# Patient Record
Sex: Female | Born: 1975 | Race: White | Hispanic: No | Marital: Married | Smoking: Never smoker
Health system: Southern US, Community
[De-identification: ages and names within clinical notes are randomized; demographics above are authoritative.]

## PROBLEM LIST (undated history)

## (undated) DIAGNOSIS — Z8619 Personal history of other infectious and parasitic diseases: Secondary | ICD-10-CM

## (undated) DIAGNOSIS — O09529 Supervision of elderly multigravida, unspecified trimester: Secondary | ICD-10-CM

## (undated) DIAGNOSIS — R569 Unspecified convulsions: Secondary | ICD-10-CM

## (undated) HISTORY — DX: Supervision of elderly multigravida, unspecified trimester: O09.529

## (undated) HISTORY — DX: Personal history of other infectious and parasitic diseases: Z86.19

## (undated) HISTORY — DX: Unspecified convulsions: R56.9

## (undated) HISTORY — PX: NO PAST SURGERIES: SHX2092

---

## 1998-04-01 ENCOUNTER — Other Ambulatory Visit: Admission: RE | Admit: 1998-04-01 | Discharge: 1998-04-01 | Payer: Self-pay | Admitting: *Deleted

## 1999-04-21 ENCOUNTER — Other Ambulatory Visit: Admission: RE | Admit: 1999-04-21 | Discharge: 1999-04-21 | Payer: Self-pay | Admitting: *Deleted

## 2001-09-29 ENCOUNTER — Other Ambulatory Visit: Admission: RE | Admit: 2001-09-29 | Discharge: 2001-09-29 | Payer: Self-pay | Admitting: *Deleted

## 2002-12-29 ENCOUNTER — Other Ambulatory Visit: Admission: RE | Admit: 2002-12-29 | Discharge: 2002-12-29 | Payer: Self-pay | Admitting: *Deleted

## 2003-07-27 ENCOUNTER — Inpatient Hospital Stay (HOSPITAL_COMMUNITY): Admission: AD | Admit: 2003-07-27 | Discharge: 2003-07-29 | Payer: Self-pay | Admitting: *Deleted

## 2003-09-04 ENCOUNTER — Other Ambulatory Visit: Admission: RE | Admit: 2003-09-04 | Discharge: 2003-09-04 | Payer: Self-pay | Admitting: Obstetrics and Gynecology

## 2004-09-22 ENCOUNTER — Other Ambulatory Visit: Admission: RE | Admit: 2004-09-22 | Discharge: 2004-09-22 | Payer: Self-pay | Admitting: Obstetrics and Gynecology

## 2006-05-09 ENCOUNTER — Inpatient Hospital Stay (HOSPITAL_COMMUNITY): Admission: AD | Admit: 2006-05-09 | Discharge: 2006-05-09 | Payer: Self-pay | Admitting: Obstetrics and Gynecology

## 2006-05-12 ENCOUNTER — Inpatient Hospital Stay (HOSPITAL_COMMUNITY): Admission: AD | Admit: 2006-05-12 | Discharge: 2006-05-12 | Payer: Self-pay | Admitting: Obstetrics and Gynecology

## 2006-05-25 ENCOUNTER — Inpatient Hospital Stay (HOSPITAL_COMMUNITY): Admission: AD | Admit: 2006-05-25 | Discharge: 2006-05-27 | Payer: Self-pay | Admitting: Obstetrics and Gynecology

## 2006-05-28 ENCOUNTER — Encounter: Admission: RE | Admit: 2006-05-28 | Discharge: 2006-06-27 | Payer: Self-pay | Admitting: Obstetrics and Gynecology

## 2006-06-28 ENCOUNTER — Encounter: Admission: RE | Admit: 2006-06-28 | Discharge: 2006-07-27 | Payer: Self-pay | Admitting: Obstetrics and Gynecology

## 2008-08-05 ENCOUNTER — Ambulatory Visit: Payer: Self-pay | Admitting: Internal Medicine

## 2008-08-08 ENCOUNTER — Ambulatory Visit: Payer: Self-pay | Admitting: Internal Medicine

## 2009-05-22 ENCOUNTER — Inpatient Hospital Stay (HOSPITAL_COMMUNITY): Admission: RE | Admit: 2009-05-22 | Discharge: 2009-05-23 | Payer: Self-pay | Admitting: Obstetrics and Gynecology

## 2010-06-08 LAB — CBC
HCT: 32.7 % — ABNORMAL LOW (ref 36.0–46.0)
Hemoglobin: 10.8 g/dL — ABNORMAL LOW (ref 12.0–15.0)
MCHC: 33 g/dL (ref 30.0–36.0)
MCHC: 33 g/dL (ref 30.0–36.0)
Platelets: 187 10*3/uL (ref 150–400)
WBC: 10.4 10*3/uL (ref 4.0–10.5)
WBC: 11.2 10*3/uL — ABNORMAL HIGH (ref 4.0–10.5)

## 2010-06-08 LAB — RPR: RPR Ser Ql: NONREACTIVE

## 2011-03-17 NOTE — L&D Delivery Note (Signed)
Delivery Note  SVD viable female Apgars 9,9 over 2nd degree ML lac.  Placenta delivered spontaneously intact with 3VC. Repair with 2-0 Chromic with good support and hemostasis noted and R/V exam confirms.  PH art was sent.  [place] cord blood was done.  Mother and baby were doing well.  EBL 300cc  Zeta Bucy, MD 

## 2011-04-28 LAB — OB RESULTS CONSOLE RUBELLA ANTIBODY, IGM: Rubella: IMMUNE

## 2011-04-28 LAB — OB RESULTS CONSOLE HEPATITIS B SURFACE ANTIGEN: Hepatitis B Surface Ag: NEGATIVE

## 2011-04-28 LAB — OB RESULTS CONSOLE GC/CHLAMYDIA: Chlamydia: NEGATIVE

## 2011-04-28 LAB — OB RESULTS CONSOLE ANTIBODY SCREEN: Antibody Screen: NEGATIVE

## 2011-04-28 LAB — OB RESULTS CONSOLE HIV ANTIBODY (ROUTINE TESTING): HIV: NONREACTIVE

## 2011-04-28 LAB — OB RESULTS CONSOLE RPR: RPR: NONREACTIVE

## 2011-11-02 LAB — OB RESULTS CONSOLE GBS: GBS: POSITIVE

## 2011-11-20 ENCOUNTER — Inpatient Hospital Stay (HOSPITAL_COMMUNITY): Payer: Self-pay

## 2011-11-23 ENCOUNTER — Inpatient Hospital Stay (HOSPITAL_COMMUNITY): Admission: AD | Admit: 2011-11-23 | Payer: Self-pay | Source: Home / Self Care | Admitting: Obstetrics and Gynecology

## 2011-11-25 ENCOUNTER — Telehealth (HOSPITAL_COMMUNITY): Payer: Self-pay | Admitting: *Deleted

## 2011-11-25 ENCOUNTER — Encounter (HOSPITAL_COMMUNITY): Payer: Self-pay | Admitting: *Deleted

## 2011-11-25 NOTE — Telephone Encounter (Signed)
Preadmission screen  

## 2011-11-26 ENCOUNTER — Telehealth (HOSPITAL_COMMUNITY): Payer: Self-pay | Admitting: *Deleted

## 2011-11-26 ENCOUNTER — Encounter (HOSPITAL_COMMUNITY): Payer: Self-pay | Admitting: *Deleted

## 2011-11-26 NOTE — Telephone Encounter (Signed)
Preadmission screen  

## 2011-12-01 ENCOUNTER — Encounter (HOSPITAL_COMMUNITY): Payer: Self-pay

## 2011-12-01 ENCOUNTER — Inpatient Hospital Stay (HOSPITAL_COMMUNITY)
Admission: RE | Admit: 2011-12-01 | Discharge: 2011-12-02 | DRG: 775 | Disposition: A | Discharge status: Home / Self Care | Payer: PRIVATE HEALTH INSURANCE | Source: Ambulatory Visit | Attending: Obstetrics and Gynecology | Admitting: Obstetrics and Gynecology

## 2011-12-01 DIAGNOSIS — Z22330 Carrier of Group B streptococcus: Secondary | ICD-10-CM

## 2011-12-01 DIAGNOSIS — O09529 Supervision of elderly multigravida, unspecified trimester: Secondary | ICD-10-CM | POA: Diagnosis present

## 2011-12-01 DIAGNOSIS — O99892 Other specified diseases and conditions complicating childbirth: Secondary | ICD-10-CM | POA: Diagnosis present

## 2011-12-01 LAB — RPR: RPR Ser Ql: NONREACTIVE

## 2011-12-01 LAB — CBC
MCH: 26.9 pg (ref 26.0–34.0)
Platelets: 227 10*3/uL (ref 150–400)
RBC: 4.46 MIL/uL (ref 3.87–5.11)
WBC: 11 10*3/uL — ABNORMAL HIGH (ref 4.0–10.5)

## 2011-12-01 LAB — TYPE AND SCREEN: Antibody Screen: NEGATIVE

## 2011-12-01 MED ORDER — PHENYLEPHRINE 40 MCG/ML (10ML) SYRINGE FOR IV PUSH (FOR BLOOD PRESSURE SUPPORT)
80.0000 ug | PREFILLED_SYRINGE | INTRAVENOUS | Status: DC | PRN
  Filled 2011-12-01: qty 2

## 2011-12-01 MED ORDER — EPHEDRINE 5 MG/ML INJ
10.0000 mg | INTRAVENOUS | Status: DC | PRN
  Filled 2011-12-01: qty 2

## 2011-12-01 MED ORDER — ZOLPIDEM TARTRATE 5 MG PO TABS: 5.0000 mg | ORAL_TABLET | Freq: Every evening | ORAL | Status: DC | PRN

## 2011-12-01 MED ORDER — OXYCODONE-ACETAMINOPHEN 5-325 MG PO TABS: 1.0000 | ORAL_TABLET | ORAL | Status: DC | PRN

## 2011-12-01 MED ORDER — DIPHENHYDRAMINE HCL 25 MG PO CAPS: 25.0000 mg | ORAL_CAPSULE | Freq: Four times a day (QID) | ORAL | Status: DC | PRN

## 2011-12-01 MED ORDER — DIBUCAINE 1 % RE OINT: 1.0000 "application " | TOPICAL_OINTMENT | RECTAL | Status: DC | PRN

## 2011-12-01 MED ORDER — OXYTOCIN 40 UNITS IN LACTATED RINGERS INFUSION - SIMPLE MED
62.5000 mL/h | Freq: Once | INTRAVENOUS | Status: DC
  Filled 2011-12-01: qty 1000

## 2011-12-01 MED ORDER — MEASLES, MUMPS & RUBELLA VAC ~~LOC~~ INJ: 0.5000 mL | INJECTION | Freq: Once | SUBCUTANEOUS | Status: DC

## 2011-12-01 MED ORDER — IBUPROFEN 600 MG PO TABS
600.0000 mg | ORAL_TABLET | Freq: Four times a day (QID) | ORAL | Status: DC | PRN
  Administered 2011-12-01: 600 mg via ORAL
  Filled 2011-12-01: qty 1

## 2011-12-01 MED ORDER — LACTATED RINGERS IV SOLN: 500.0000 mL | Freq: Once | INTRAVENOUS | Status: DC

## 2011-12-01 MED ORDER — TERBUTALINE SULFATE 1 MG/ML IJ SOLN: 0.2500 mg | Freq: Once | INTRAMUSCULAR | Status: DC | PRN

## 2011-12-01 MED ORDER — SODIUM CHLORIDE 0.9 % IV SOLN
2.0000 g | Freq: Four times a day (QID) | INTRAVENOUS | Status: DC
  Administered 2011-12-01 (×2): 2 g via INTRAVENOUS
  Filled 2011-12-01 (×4): qty 2000

## 2011-12-01 MED ORDER — IBUPROFEN 600 MG PO TABS
600.0000 mg | ORAL_TABLET | Freq: Four times a day (QID) | ORAL | Status: DC
  Administered 2011-12-01 – 2011-12-02 (×3): 600 mg via ORAL
  Filled 2011-12-01 (×4): qty 1

## 2011-12-01 MED ORDER — LANOLIN HYDROUS EX OINT: TOPICAL_OINTMENT | CUTANEOUS | Status: DC | PRN

## 2011-12-01 MED ORDER — ONDANSETRON HCL 4 MG/2ML IJ SOLN: 4.0000 mg | Freq: Four times a day (QID) | INTRAMUSCULAR | Status: DC | PRN

## 2011-12-01 MED ORDER — BENZOCAINE-MENTHOL 20-0.5 % EX AERO: 1.0000 "application " | INHALATION_SPRAY | CUTANEOUS | Status: DC | PRN

## 2011-12-01 MED ORDER — ACETAMINOPHEN 325 MG PO TABS: 650.0000 mg | ORAL_TABLET | ORAL | Status: DC | PRN

## 2011-12-01 MED ORDER — FLEET ENEMA 7-19 GM/118ML RE ENEM: 1.0000 | ENEMA | RECTAL | Status: DC | PRN

## 2011-12-01 MED ORDER — OXYTOCIN BOLUS FROM INFUSION
500.0000 mL | Freq: Once | INTRAVENOUS | Status: AC
  Administered 2011-12-01: 500 mL via INTRAVENOUS
  Filled 2011-12-01: qty 500

## 2011-12-01 MED ORDER — LACTATED RINGERS IV SOLN: 500.0000 mL | INTRAVENOUS | Status: DC | PRN

## 2011-12-01 MED ORDER — SENNOSIDES-DOCUSATE SODIUM 8.6-50 MG PO TABS
2.0000 | ORAL_TABLET | Freq: Every day | ORAL | Status: DC
  Administered 2011-12-01: 2 via ORAL

## 2011-12-01 MED ORDER — MEDROXYPROGESTERONE ACETATE 150 MG/ML IM SUSP: 150.0000 mg | INTRAMUSCULAR | Status: DC | PRN

## 2011-12-01 MED ORDER — OXYTOCIN 40 UNITS IN LACTATED RINGERS INFUSION - SIMPLE MED
1.0000 m[IU]/min | INTRAVENOUS | Status: DC
  Administered 2011-12-01: 2 m[IU]/min via INTRAVENOUS

## 2011-12-01 MED ORDER — ONDANSETRON HCL 4 MG PO TABS: 4.0000 mg | ORAL_TABLET | ORAL | Status: DC | PRN

## 2011-12-01 MED ORDER — WITCH HAZEL-GLYCERIN EX PADS: 1.0000 "application " | MEDICATED_PAD | CUTANEOUS | Status: DC | PRN

## 2011-12-01 MED ORDER — SIMETHICONE 80 MG PO CHEW: 80.0000 mg | CHEWABLE_TABLET | ORAL | Status: DC | PRN

## 2011-12-01 MED ORDER — FENTANYL 2.5 MCG/ML BUPIVACAINE 1/10 % EPIDURAL INFUSION (WH - ANES): 14.0000 mL/h | INTRAMUSCULAR | Status: DC

## 2011-12-01 MED ORDER — LACTATED RINGERS IV SOLN
INTRAVENOUS | Status: DC
Start: 2011-12-01 — End: 2011-12-01
  Administered 2011-12-01: 125 mL/h via INTRAVENOUS

## 2011-12-01 MED ORDER — TETANUS-DIPHTH-ACELL PERTUSSIS 5-2.5-18.5 LF-MCG/0.5 IM SUSP: 0.5000 mL | Freq: Once | INTRAMUSCULAR | Status: DC

## 2011-12-01 MED ORDER — CITRIC ACID-SODIUM CITRATE 334-500 MG/5ML PO SOLN: 30.0000 mL | ORAL | Status: DC | PRN

## 2011-12-01 MED ORDER — INFLUENZA VIRUS VACC SPLIT PF IM SUSP
0.5000 mL | INTRAMUSCULAR | Status: AC
  Administered 2011-12-02: 0.5 mL via INTRAMUSCULAR
  Filled 2011-12-01: qty 0.5

## 2011-12-01 MED ORDER — ONDANSETRON HCL 4 MG/2ML IJ SOLN: 4.0000 mg | INTRAMUSCULAR | Status: DC | PRN

## 2011-12-01 MED ORDER — DIPHENHYDRAMINE HCL 50 MG/ML IJ SOLN: 12.5000 mg | INTRAMUSCULAR | Status: DC | PRN

## 2011-12-01 MED ORDER — LIDOCAINE HCL (PF) 1 % IJ SOLN
30.0000 mL | INTRAMUSCULAR | Status: DC | PRN
  Administered 2011-12-01: 30 mL via SUBCUTANEOUS
  Filled 2011-12-01: qty 30

## 2011-12-01 MED ORDER — PRENATAL MULTIVITAMIN CH
1.0000 | ORAL_TABLET | Freq: Every day | ORAL | Status: DC
  Administered 2011-12-02: 1 via ORAL
  Filled 2011-12-01: qty 1

## 2011-12-01 NOTE — H&P (Signed)
Jacqueline Malone is a 36 y.o. female presenting for Induction of labor due to favorable cervix, GBS+ and history of 1.5 hour labor (induction) last pregnancy. History OB History    Grav Para Term Preterm Abortions TAB SAB Ect Mult Living   5 3 3  1 1    3      Past Medical History  Diagnosis Date  . AMA (advanced maternal age) multigravida 35+   . H/O varicella   . Seizures     as teen hormone related treated   Past Surgical History  Procedure Date  . No past surgeries    Family History: family history includes Cancer in her maternal grandmother and paternal grandfather; Diabetes in her paternal uncle; and Hypertension in her father. Social History:  reports that she has never smoked. She has never used smokeless tobacco. She reports that she does not drink alcohol or use illicit drugs.   Prenatal Transfer Tool  Maternal Diabetes: No Genetic Screening: Normal Maternal Ultrasounds/Referrals: Normal Fetal Ultrasounds or other Referrals:  None Maternal Substance Abuse:  No Significant Maternal Medications:  None Significant Maternal Lab Results:  None Other Comments:  None  ROS  Dilation: 2.5 Effacement (%): 50 Station: -3 Exam by:: Jacqueline Malone Blood pressure 120/70, pulse 95, temperature 97.3 F (36.3 C), temperature source Oral, resp. rate 20, height 5\' 4"  (1.626 m), weight 94.348 kg (208 lb), last menstrual period 02/17/2011. Exam Physical Exam  Prenatal labs: ABO, Rh: O/Positive/-- (02/12 0000) Antibody: Negative (02/12 0000) Rubella: Immune (02/12 0000) RPR: Nonreactive (02/12 0000)  HBsAg: Negative (02/12 0000)  HIV: Non-reactive (02/12 0000)  GBS: Positive (08/19 0000)   Assessment/Plan: IUP at 39+weeks Favorable cervix GBS pos S/P Abx Anticipate SVD   Jacqueline Malone C 12/01/2011, 8:55 AM

## 2011-12-02 LAB — CBC
HCT: 33.7 % — ABNORMAL LOW (ref 36.0–46.0)
Hemoglobin: 11.2 g/dL — ABNORMAL LOW (ref 12.0–15.0)
MCV: 81.6 fL (ref 78.0–100.0)
RBC: 4.13 MIL/uL (ref 3.87–5.11)
RDW: 13.5 % (ref 11.5–15.5)
WBC: 13.4 10*3/uL — ABNORMAL HIGH (ref 4.0–10.5)

## 2011-12-02 NOTE — Discharge Summary (Signed)
Obstetric Discharge Summary Reason for Admission: induction of labor Prenatal Procedures: ultrasound Intrapartum Procedures: spontaneous vaginal delivery Postpartum Procedures: none Complications-Operative and Postpartum: 2 degree perineal laceration Hemoglobin  Date Value Range Status  12/02/2011 11.2* 12.0 - 15.0 g/dL Final     HCT  Date Value Range Status  12/02/2011 33.7* 36.0 - 46.0 % Final    Physical Exam:  General: alert and cooperative Lochia: appropriate Uterine Fundus: firm Incision: perineum intact DVT Evaluation: No evidence of DVT seen on physical exam.  Discharge Diagnoses: Term Pregnancy-delivered  Discharge Information: Date: 12/02/2011 Activity: pelvic rest Diet: routine Medications: PNV and Ibuprofen Condition: stable Instructions: refer to practice specific booklet Discharge to: home   Newborn Data: Live born female  Birth Weight: 7 lb 14.5 oz (3586 g) APGAR: , 9  Home with mother.  Rashaunda Rahl G 12/02/2011, 8:05 AM

## 2011-12-02 NOTE — Progress Notes (Signed)
Post Partum Day 1 Subjective: no complaints, up ad lib, voiding, tolerating PO and desires early discharge  Objective: Blood pressure 101/67, pulse 66, temperature 97.7 F (36.5 C), temperature source Oral, resp. rate 18, height 5\' 4"  (1.626 m), weight 94.348 kg (208 lb), last menstrual period 02/17/2011, SpO2 97.00%, unknown if currently breastfeeding.  Physical Exam:  General: alert and cooperative Lochia: appropriate Uterine Fundus: firm Incision: perineum intact DVT Evaluation: No evidence of DVT seen on physical exam.   Basename 12/02/11 0516 12/01/11 0705  HGB 11.2* 12.0  HCT 33.7* 36.4    Assessment/Plan: Discharge home this pm   LOS: 1 day   Jacqueline Malone G 12/02/2011, 7:59 AM

## 2011-12-07 ENCOUNTER — Encounter (HOSPITAL_COMMUNITY): Payer: Self-pay

## 2011-12-13 ENCOUNTER — Ambulatory Visit: Payer: Self-pay | Admitting: Internal Medicine

## 2014-01-15 ENCOUNTER — Encounter (HOSPITAL_COMMUNITY): Payer: Self-pay

## 2019-02-23 ENCOUNTER — Other Ambulatory Visit: Payer: Self-pay

## 2019-02-23 DIAGNOSIS — Z20822 Contact with and (suspected) exposure to covid-19: Secondary | ICD-10-CM

## 2019-02-24 LAB — NOVEL CORONAVIRUS, NAA: SARS-CoV-2, NAA: NOT DETECTED

## 2019-03-22 ENCOUNTER — Other Ambulatory Visit: Payer: PRIVATE HEALTH INSURANCE

## 2019-04-12 ENCOUNTER — Other Ambulatory Visit: Payer: Self-pay | Admitting: Physician Assistant

## 2019-04-12 DIAGNOSIS — G8929 Other chronic pain: Secondary | ICD-10-CM

## 2019-04-22 ENCOUNTER — Other Ambulatory Visit: Payer: Self-pay

## 2019-04-22 ENCOUNTER — Ambulatory Visit
Admission: RE | Admit: 2019-04-22 | Discharge: 2019-04-22 | Disposition: A | Discharge status: Home / Self Care | Payer: 59 | Source: Ambulatory Visit | Attending: Physician Assistant | Admitting: Physician Assistant

## 2019-04-22 DIAGNOSIS — M545 Low back pain, unspecified: Secondary | ICD-10-CM

## 2019-04-22 DIAGNOSIS — M25551 Pain in right hip: Secondary | ICD-10-CM | POA: Diagnosis present

## 2019-04-22 DIAGNOSIS — G8929 Other chronic pain: Secondary | ICD-10-CM | POA: Diagnosis present

## 2019-05-31 ENCOUNTER — Other Ambulatory Visit: Payer: Self-pay | Admitting: Gastroenterology

## 2019-05-31 DIAGNOSIS — R102 Pelvic and perineal pain: Secondary | ICD-10-CM

## 2019-05-31 DIAGNOSIS — R140 Abdominal distension (gaseous): Secondary | ICD-10-CM

## 2019-06-12 ENCOUNTER — Other Ambulatory Visit: Payer: Self-pay

## 2019-06-12 ENCOUNTER — Ambulatory Visit
Admission: RE | Admit: 2019-06-12 | Discharge: 2019-06-12 | Disposition: A | Discharge status: Home / Self Care | Payer: 59 | Source: Ambulatory Visit | Attending: Gastroenterology | Admitting: Gastroenterology

## 2019-06-12 DIAGNOSIS — R102 Pelvic and perineal pain: Secondary | ICD-10-CM | POA: Diagnosis present

## 2019-06-12 DIAGNOSIS — R140 Abdominal distension (gaseous): Secondary | ICD-10-CM | POA: Diagnosis not present

## 2019-07-22 ENCOUNTER — Ambulatory Visit: Payer: 59 | Attending: Internal Medicine

## 2019-07-22 DIAGNOSIS — Z23 Encounter for immunization: Secondary | ICD-10-CM

## 2019-07-22 NOTE — Progress Notes (Addendum)
   Covid-19 Vaccination Clinic  Name:  Jacqueline Malone    MRN: [id number] DOB: 12-06-1975  07/22/2019  [name] was observed post Covid-19 immunization for 30 minutes without incident. She was provided with Vaccine Information Sheet and instruction to access the V-Safe system.   [name] was instructed to call 911 with any severe reactions post vaccine: Marland Kitchen Difficulty breathing  . Swelling of face and throat  . A fast heartbeat  . A bad rash all over body  . Dizziness and weakness   Immunizations Administered    Name Date Dose VIS Date Route   Pfizer COVID-19 Vaccine 07/22/2019 11:44 AM 0.3 mL 05/10/2018 Intramuscular   Manufacturer: [employer], [employer]   Lot: [id number]   NDC: [address]-5

## 2019-08-15 ENCOUNTER — Ambulatory Visit: Payer: 59

## 2019-10-13 ENCOUNTER — Ambulatory Visit
Admission: RE | Admit: 2019-10-13 | Discharge: 2019-10-13 | Disposition: A | Discharge status: Home / Self Care | Payer: 59 | Source: Ambulatory Visit | Attending: Emergency Medicine | Admitting: Emergency Medicine

## 2019-10-13 ENCOUNTER — Other Ambulatory Visit: Payer: Self-pay

## 2019-10-13 VITALS — BP 122/85 | HR 83 | Temp 98.3°F | Resp 16

## 2019-10-13 DIAGNOSIS — J019 Acute sinusitis, unspecified: Secondary | ICD-10-CM | POA: Insufficient documentation

## 2019-10-13 LAB — POCT RAPID STREP A (OFFICE): Rapid Strep A Screen: NEGATIVE

## 2019-10-13 MED ORDER — PREDNISONE 10 MG (21) PO TBPK: ORAL_TABLET | Freq: Every day | ORAL | 0 refills | Status: DC

## 2019-10-13 MED ORDER — AMOXICILLIN 875 MG PO TABS: 875.0000 mg | ORAL_TABLET | Freq: Two times a day (BID) | ORAL | 0 refills | Status: AC

## 2019-10-13 NOTE — ED Triage Notes (Addendum)
Patient c/o nasal congestion, cough, and nasal drainage x3 weeks. States she is constantly having to clear her throat and reports that it now feels like her throat is "swelling closed". Also endorses diarrhea x24 hours, lower back pain, and chills.  Denies fever, chest pain  Patient reports she did an at home covid test two days ago and it came back negative. Declines testing today. Patient also requesting to be tested for strep.

## 2019-10-13 NOTE — Discharge Instructions (Signed)
Take the amoxicillin and prednisone as directed.    Your COVID test is pending.  You should self quarantine until the test result is back.    Go to the emergency department if you develop acute worsening symptoms, including difficulty swallowing or breathing.

## 2019-10-13 NOTE — ED Provider Notes (Signed)
[facility]    CSN: [id number] Arrival date & time: 10/13/19  1459      History   Chief Complaint Chief Complaint  Patient presents with  . Cough  . Nasal Congestion    HPI Jacqueline Malone is a 44 y.o. female.   Patient presents with postnasal drip x3 weeks.  She also reports nonproductive cough, nasal congestion, chills, sore throat, and diarrhea.  She states the sinus drainage is so thick it makes her feel like her throat is closing.  She denies fever, rash, cough, shortness of breath, vomiting, or other symptoms.  She took an at-home Covid test which was negative.  The history is provided by the patient and the spouse.    Past Medical History:  Diagnosis Date  . AMA (advanced maternal age) multigravida 35+   . H/O varicella   . Seizures ([facility])    as teen hormone related treated    There are no problems to display for this patient.   Past Surgical History:  Procedure Laterality Date  . NO PAST SURGERIES      OB History    Gravida  5   Para  4   Term  4   Preterm      AB  1   Living  4     SAB      TAB  1   Ectopic      Multiple      Live Births  4            Home Medications    Prior to Admission medications   Medication Sig Start Date End Date Taking? Authorizing Provider  amoxicillin (AMOXIL) 875 MG tablet Take 1 tablet (875 mg total) by mouth 2 (two) times daily for 7 days. 10/13/19 10/20/19  Mickie Bail, NP  predniSONE (STERAPRED UNI-PAK 21 TAB) 10 MG (21) TBPK tablet Take by mouth daily. Take 6 tabs by mouth daily  for 1 day, then 5 tabs for 1 day, then 4 tabs for 1 day, then 3 tabs for 1 day, 2 tabs for 1 day, then 1 tab by mouth daily for 1 day 10/13/19   Mickie Bail, NP    Family History Family History  Problem Relation Age of Onset  . Hypertension Father   . Diabetes Paternal Uncle   . Cancer Maternal Grandmother        breast  . Cancer Paternal Grandfather        colon    Social History Social History    Tobacco Use  . Smoking status: Never Smoker  . Smokeless tobacco: Never Used  Substance Use Topics  . Alcohol use: No  . Drug use: No     Allergies   Erythromycin   Review of Systems Review of Systems  Constitutional: Positive for chills. Negative for fever.  HENT: Positive for congestion, postnasal drip and sore throat. Negative for ear pain.   Eyes: Negative for pain and visual disturbance.  Respiratory: Negative for cough and shortness of breath.   Cardiovascular: Negative for chest pain and palpitations.  Gastrointestinal: Positive for diarrhea. Negative for abdominal pain and vomiting.  Genitourinary: Negative for dysuria and hematuria.  Musculoskeletal: Negative for arthralgias and back pain.  Skin: Negative for color change and rash.  Neurological: Negative for seizures and syncope.  All other systems reviewed and are negative.    Physical Exam Triage Vital Signs ED Triage Vitals  Enc Vitals Group     BP  10/13/19 1511 122/85     Pulse Rate 10/13/19 1511 83     Resp 10/13/19 1511 16     Temp 10/13/19 1511 98.3 F (36.8 C)     Temp src --      SpO2 10/13/19 1511 97 %     Weight --      Height --      Head Circumference --      Peak Flow --      Pain Score 10/13/19 1506 0     Pain Loc --      Pain Edu? --      Excl. in [place]? --    No data found.  Updated Vital Signs BP 122/85   Pulse 83   Temp 98.3 F (36.8 C)   Resp 16   LMP 09/22/2019 (Within Days)   SpO2 97%   Visual Acuity Right Eye Distance:   Left Eye Distance:   Bilateral Distance:    Right Eye Near:   Left Eye Near:    Bilateral Near:     Physical Exam Vitals and nursing note reviewed.  Constitutional:      General: She is not in acute distress.    Appearance: She is well-developed.  HENT:     Head: Normocephalic and atraumatic.     Right Ear: Tympanic membrane normal.     Left Ear: Tympanic membrane normal.     Nose: Nose normal.     Mouth/Throat:     Mouth: Mucous  membranes are moist.     Pharynx: Posterior oropharyngeal erythema present.     Tonsils: 0 on the right. 0 on the left.  Eyes:     Conjunctiva/sclera: Conjunctivae normal.  Cardiovascular:     Rate and Rhythm: Normal rate and regular rhythm.     Heart sounds: No murmur heard.   Pulmonary:     Effort: Pulmonary effort is normal. No respiratory distress.     Breath sounds: Normal breath sounds. No wheezing or rhonchi.  Abdominal:     Palpations: Abdomen is soft.     Tenderness: There is no abdominal tenderness. There is no guarding or rebound.  Musculoskeletal:     Cervical back: Neck supple.  Skin:    General: Skin is warm and dry.     Findings: No rash.  Neurological:     General: No focal deficit present.     Mental Status: She is alert and oriented to person, place, and time.     Gait: Gait normal.      UC Treatments / Results  Labs (all labs ordered are listed, but only abnormal results are displayed) Labs Reviewed  CULTURE, GROUP A STREP ([facility])  NOVEL CORONAVIRUS, NAA  POCT RAPID STREP A (OFFICE)    EKG   Radiology No results found.  Procedures Procedures (including critical care time)  Medications Ordered in UC Medications - No data to display  Initial Impression / Assessment and Plan / UC Course  I have reviewed the triage vital signs and the nursing notes.  Pertinent labs & imaging results that were available during my care of the patient were reviewed by me and considered in my medical decision making (see chart for details).   Acute sinusitis.  Instructed patient to call 911 or go to the ED if she has difficulty swallowing or breathing; or feels like her throat is closing.  Treating with amoxicillin and prednisone.  PCR COVID pending.  Instructed her to self quarantine until the test  result is back.  Instructed her to follow-up with her PCP next week.  Patient agrees to plan of care.   Final Clinical Impressions(s) / UC Diagnoses   Final diagnoses:   Acute non-recurrent sinusitis, unspecified location     Discharge Instructions     Take the amoxicillin and prednisone as directed.    Your COVID test is pending.  You should self quarantine until the test result is back.    Go to the emergency department if you develop acute worsening symptoms, including difficulty swallowing or breathing.          ED Prescriptions    Medication Sig Dispense Auth. Provider   amoxicillin (AMOXIL) 875 MG tablet Take 1 tablet (875 mg total) by mouth 2 (two) times daily for 7 days. 14 tablet Mickie Bail, NP   predniSONE (STERAPRED UNI-PAK 21 TAB) 10 MG (21) TBPK tablet Take by mouth daily. Take 6 tabs by mouth daily  for 1 day, then 5 tabs for 1 day, then 4 tabs for 1 day, then 3 tabs for 1 day, 2 tabs for 1 day, then 1 tab by mouth daily for 1 day 21 tablet Mickie Bail, NP     PDMP not reviewed this encounter.   Mickie Bail, NP 10/13/19 [phone]

## 2019-10-14 LAB — NOVEL CORONAVIRUS, NAA: SARS-CoV-2, NAA: NOT DETECTED

## 2019-10-14 LAB — SARS-COV-2, NAA 2 DAY TAT

## 2019-10-15 LAB — CULTURE, GROUP A STREP (THRC)

## 2020-03-23 ENCOUNTER — Other Ambulatory Visit: Payer: 59

## 2020-10-27 ENCOUNTER — Ambulatory Visit
Admission: EM | Admit: 2020-10-27 | Discharge: 2020-10-27 | Disposition: A | Discharge status: Home / Self Care | Payer: 59 | Attending: Emergency Medicine | Admitting: Emergency Medicine

## 2020-10-27 DIAGNOSIS — J0100 Acute maxillary sinusitis, unspecified: Secondary | ICD-10-CM | POA: Diagnosis not present

## 2020-10-27 MED ORDER — PROMETHAZINE-DM 6.25-15 MG/5ML PO SYRP
5.0000 mL | ORAL_SOLUTION | Freq: Four times a day (QID) | ORAL | 0 refills | Status: AC | PRN
Start: 2020-10-27 — End: ?

## 2020-10-27 MED ORDER — AMOXICILLIN-POT CLAVULANATE 875-125 MG PO TABS: 1.0000 | ORAL_TABLET | Freq: Two times a day (BID) | ORAL | 0 refills | Status: AC

## 2020-10-27 MED ORDER — BENZONATATE 100 MG PO CAPS: 200.0000 mg | ORAL_CAPSULE | Freq: Three times a day (TID) | ORAL | 0 refills | Status: AC

## 2020-10-27 MED ORDER — IPRATROPIUM BROMIDE 0.06 % NA SOLN
2.0000 | Freq: Four times a day (QID) | NASAL | 12 refills | Status: AC
Start: 2020-10-27 — End: ?

## 2020-10-27 NOTE — ED Triage Notes (Signed)
Pt c/o sinus congestion, pressure and headache for over a week, pt states mucus is dark and has some blood in it. Pt also reports occasional cough. Pt recently had COVID, is about 14 days out. Pt denies f/n/v/d or other symptoms. Pt has been taking OTC decongestants with no relief.

## 2020-10-27 NOTE — Discharge Instructions (Addendum)
The Augmentin twice daily with food for 10 days for treatment of your sinusitis.  Perform sinus irrigation 2-3 times a day with a NeilMed sinus rinse kit and distilled water.  Do not use tap water.  You can use plain over-the-counter Mucinex every 6 hours to break up the stickiness of the mucus so your body can clear it.  Increase your oral fluid intake to thin out your mucus so that is also able for your body to clear more easily.  Take an over-the-counter probiotic, such as Culturelle-align-activia, 1 hour after each dose of antibiotic to prevent diarrhea.  Use the Atrovent nasal spray, 2 squirts in each nostril every 6 hours. as needed for nasal congestion.  Use the tessalon perles during the day for cough and the promethazine cough syrup at bedtime as it will make you drowsy.   If you develop any new or worsening symptoms return for reevaluation or see your primary care provider.

## 2020-10-27 NOTE — ED Provider Notes (Signed)
[facility]    CSN: [id number] Arrival date & time: 10/27/20  0948      History   Chief Complaint Chief Complaint  Patient presents with   Nasal Congestion    HPI Jacqueline Malone is a 45 y.o. female.   HPI  45 year old female here for evaluation of sinus complaints.  Patient reports that she has been experiencing sinus pressure and pain for greater than a week.  She has been experiencing dark nasal drainage for over a week and she developed bloody discharge this morning.  This has been associated with pain in her upper teeth.  The pain has inhibited her ability to eat due to pain when chewing.  She is also complaining of pain and pressure in both ears.  Patient is also been experiencing a cough that is intermittently productive for the same dark sputum.  She has not had a fever, sore throat, shortness breath or wheezing, or GI complaints.  Patient was positive for COVID 2 weeks ago.  Past Medical History:  Diagnosis Date   AMA (advanced maternal age) multigravida 79+    H/O varicella    Seizures ([place])    as teen hormone related treated    There are no problems to display for this patient.   Past Surgical History:  Procedure Laterality Date   NO PAST SURGERIES      OB History     Gravida  5   Para  4   Term  4   Preterm      AB  1   Living  4      SAB      IAB  1   Ectopic      Multiple      Live Births  4            Home Medications    Prior to Admission medications   Medication Sig Start Date End Date Taking? Authorizing Provider  amoxicillin-clavulanate (AUGMENTIN) 875-125 MG tablet Take 1 tablet by mouth every 12 (twelve) hours for 10 days. 10/27/20 11/06/20 Yes Margarette Canada, NP  benzonatate (TESSALON) 100 MG capsule Take 2 capsules (200 mg total) by mouth every 8 (eight) hours. 10/27/20  Yes Margarette Canada, NP  ipratropium (ATROVENT) 0.06 % nasal spray Place 2 sprays into both nostrils 4 (four) times daily. 10/27/20  Yes Margarette Canada, NP  promethazine-dextromethorphan (PROMETHAZINE-DM) 6.25-15 MG/5ML syrup Take 5 mLs by mouth 4 (four) times daily as needed. 10/27/20  Yes Margarette Canada, NP    Family History Family History  Problem Relation Age of Onset   Hypertension Father    Diabetes Paternal Uncle    Cancer Maternal Grandmother        breast   Cancer Paternal Grandfather        colon    Social History Social History   Tobacco Use   Smoking status: Never   Smokeless tobacco: Never  Substance Use Topics   Alcohol use: No   Drug use: No     Allergies   Erythromycin   Review of Systems Review of Systems  Constitutional:  Negative for activity change, appetite change and fever.  HENT:  Positive for congestion, ear pain, postnasal drip, rhinorrhea, sinus pressure and sinus pain. Negative for sore throat.   Respiratory:  Positive for cough. Negative for shortness of breath and wheezing.   Gastrointestinal:  Negative for diarrhea, nausea and vomiting.  Neurological:  Positive for headaches.  Hematological: Negative.   Psychiatric/Behavioral: Negative.  Physical Exam Triage Vital Signs ED Triage Vitals  Enc Vitals Group     BP 10/27/20 1006 109/68     Pulse Rate 10/27/20 1006 96     Resp 10/27/20 1006 18     Temp 10/27/20 1006 98.3 F (36.8 C)     Temp Source 10/27/20 1006 Oral     SpO2 10/27/20 1006 100 %     Weight 10/27/20 1004 185 lb (83.9 kg)     Height 10/27/20 1004 5' 4"  (1.626 m)     Head Circumference --      Peak Flow --      Pain Score 10/27/20 1003 5     Pain Loc --      Pain Edu? --      Excl. in [place]? --    No data found.  Updated Vital Signs BP 109/68 (BP Location: Left Arm)   Pulse 96   Temp 98.3 F (36.8 C) (Oral)   Resp 18   Ht 5' 4"  (1.626 m)   Wt 185 lb (83.9 kg)   LMP 10/23/2020   SpO2 100%   Breastfeeding No   BMI 31.76 kg/m   Visual Acuity Right Eye Distance:   Left Eye Distance:   Bilateral Distance:    Right Eye Near:   Left Eye Near:     Bilateral Near:     Physical Exam Vitals and nursing note reviewed.  Constitutional:      General: She is not in acute distress.    Appearance: Normal appearance. She is normal weight. She is not ill-appearing.  HENT:     Head: Normocephalic and atraumatic.     Right Ear: Tympanic membrane, ear canal and external ear normal. There is no impacted cerumen.     Left Ear: Tympanic membrane, ear canal and external ear normal. There is no impacted cerumen.     Nose: Congestion and rhinorrhea present.     Mouth/Throat:     Mouth: Mucous membranes are moist.     Pharynx: Oropharynx is clear.  Cardiovascular:     Rate and Rhythm: Normal rate and regular rhythm.     Pulses: Normal pulses.     Heart sounds: Normal heart sounds. No murmur heard.   No gallop.  Pulmonary:     Effort: Pulmonary effort is normal.     Breath sounds: Normal breath sounds. No wheezing, rhonchi or rales.  Musculoskeletal:     Cervical back: Normal range of motion and neck supple.  Lymphadenopathy:     Cervical: No cervical adenopathy.  Skin:    General: Skin is warm and dry.     Capillary Refill: Capillary refill takes less than 2 seconds.     Findings: No erythema or rash.  Neurological:     General: No focal deficit present.     Mental Status: She is alert and oriented to person, place, and time.  Psychiatric:        Mood and Affect: Mood normal.        Behavior: Behavior normal.        Thought Content: Thought content normal.        Judgment: Judgment normal.     UC Treatments / Results  Labs (all labs ordered are listed, but only abnormal results are displayed) Labs Reviewed - No data to display  EKG   Radiology No results found.  Procedures Procedures (including critical care time)  Medications Ordered in UC Medications - No data to display  Initial  Impression / Assessment and Plan / UC Course  I have reviewed the triage vital signs and the nursing notes.  Pertinent labs & imaging  results that were available during my care of the patient were reviewed by me and considered in my medical decision making (see chart for details).  Patient is a very pleasant and nontoxic-appearing 45 year old female here for evaluation of sinus complaints as outlined in HPI above.  Patient's physical exam reveals pearly gray tympanic membranes bilaterally with normal light reflex and mildly ceruminous external auditory canals.  Nasal mucosa is erythematous and edematous with purulent discharge in both nares.  Patient does have tenderness to percussion of bilateral maxillary sinuses.  Oropharyngeal exam is unremarkable.  Patient does not have any cervical lymphadenopathy appreciated on exam.  Cardiopulmonary exam reveals clear lung sounds in all fields.  Patient's exam is consistent with sinusitis, most likely secondary to her recent COVID infection, and will treat with Augmentin twice daily for 10 days.  Also give Atrovent nasal spray to help with nasal congestion, Tessalon Perles and Promethazine DM cough syrup to help with cough and congestion, and I have encouraged patient to perform sinus irrigation to help resolve her sinus infection syndrome.  Return precautions reviewed with patient.   Final Clinical Impressions(s) / UC Diagnoses   Final diagnoses:  Acute non-recurrent maxillary sinusitis     Discharge Instructions      The Augmentin twice daily with food for 10 days for treatment of your sinusitis.  Perform sinus irrigation 2-3 times a day with a NeilMed sinus rinse kit and distilled water.  Do not use tap water.  You can use plain over-the-counter Mucinex every 6 hours to break up the stickiness of the mucus so your body can clear it.  Increase your oral fluid intake to thin out your mucus so that is also able for your body to clear more easily.  Take an over-the-counter probiotic, such as Culturelle-align-activia, 1 hour after each dose of antibiotic to prevent diarrhea.  Use the  Atrovent nasal spray, 2 squirts in each nostril every 6 hours. as needed for nasal congestion.  Use the tessalon perles during the day for cough and the promethazine cough syrup at bedtime as it will make you drowsy.   If you develop any new or worsening symptoms return for reevaluation or see your primary care provider.      ED Prescriptions     Medication Sig Dispense Auth. Provider   amoxicillin-clavulanate (AUGMENTIN) 875-125 MG tablet Take 1 tablet by mouth every 12 (twelve) hours for 10 days. 20 tablet Margarette Canada, NP   ipratropium (ATROVENT) 0.06 % nasal spray Place 2 sprays into both nostrils 4 (four) times daily. 15 mL Margarette Canada, NP   benzonatate (TESSALON) 100 MG capsule Take 2 capsules (200 mg total) by mouth every 8 (eight) hours. 21 capsule Margarette Canada, NP   promethazine-dextromethorphan (PROMETHAZINE-DM) 6.25-15 MG/5ML syrup Take 5 mLs by mouth 4 (four) times daily as needed. 118 mL Margarette Canada, NP      PDMP not reviewed this encounter.   Margarette Canada, NP 10/27/20 1028

## 2021-03-11 IMAGING — US US PELVIS COMPLETE WITH TRANSVAGINAL
1 series · 13 of 25 positions shown · non-contrast
Comparison: None available.

CLINICAL DATA: Initial evaluation for bloating, vaginal pain for 4
months.



[Series 1: us pelvis complete with transvaginal · 0.26mm/px · 13 of 115 slices shown]
[im 1/115]
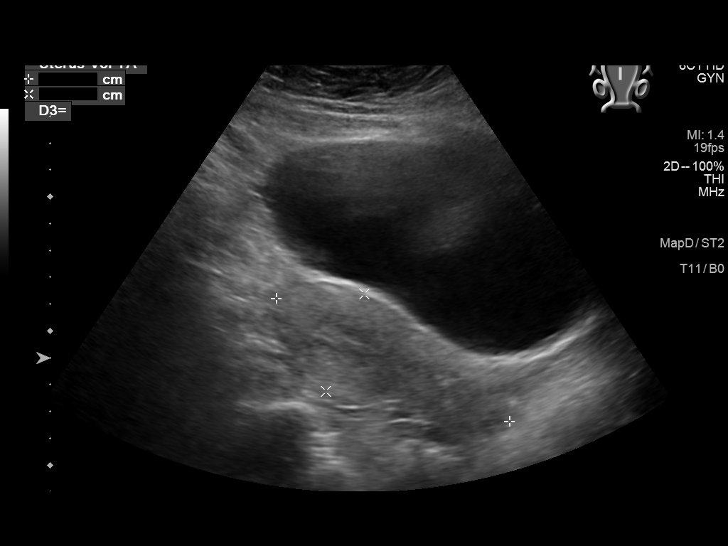
[im 10/115]
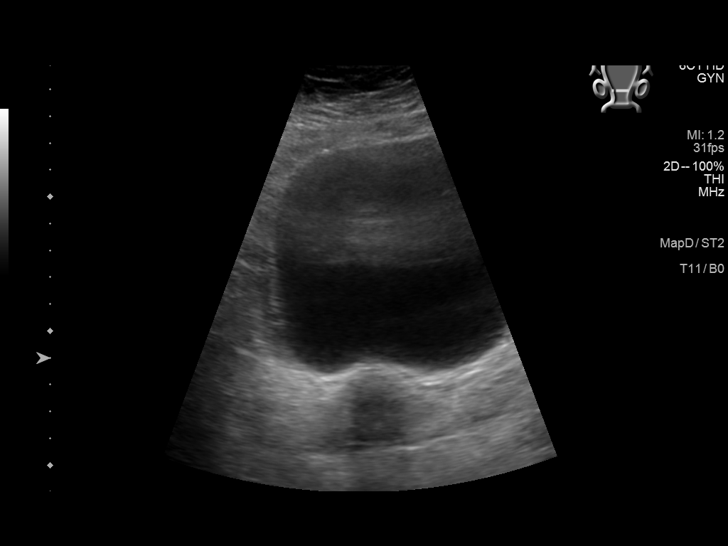
[im 20/115]
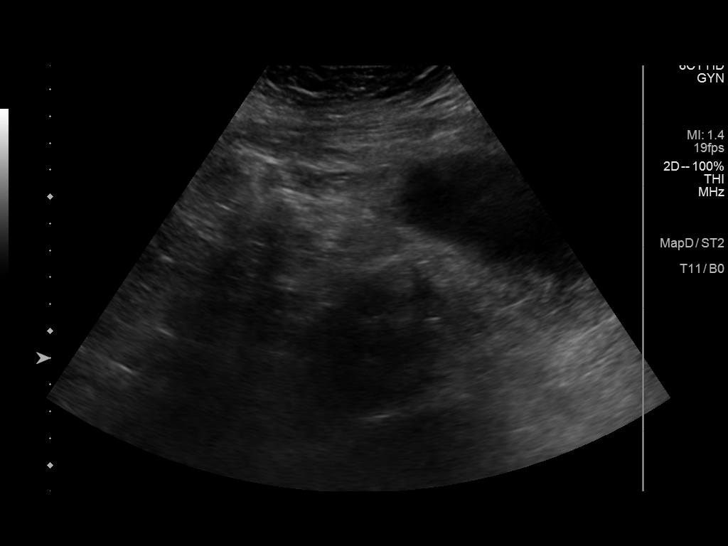
[im 29/115]
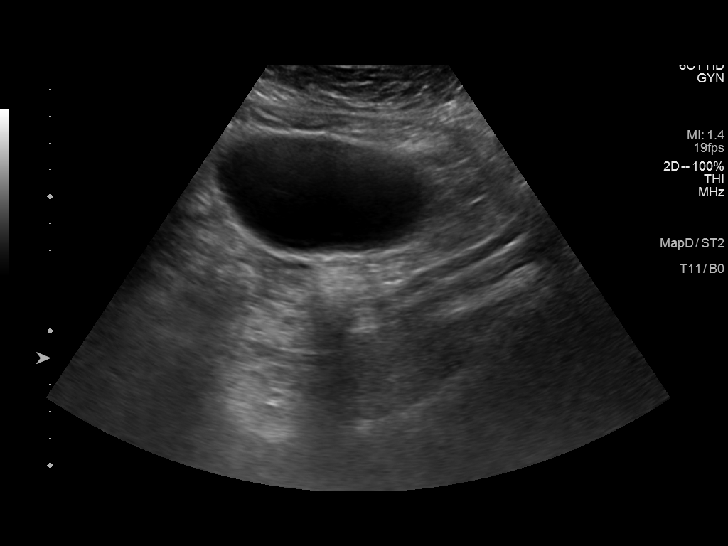
[im 39/115]
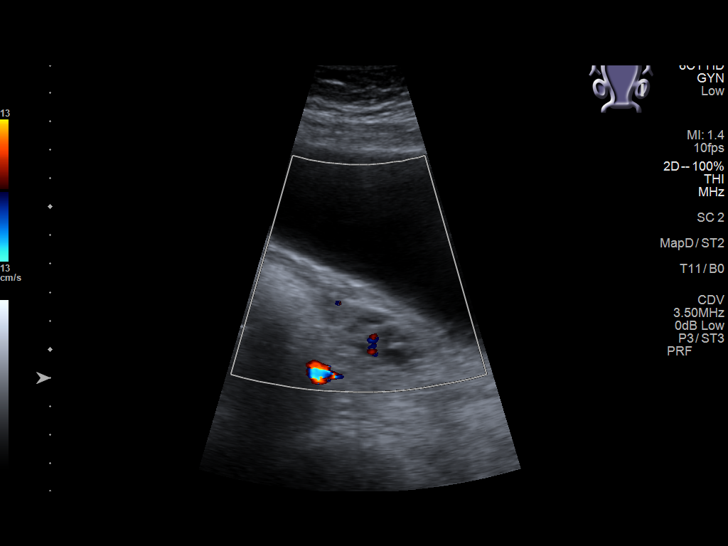
[im 48/115]
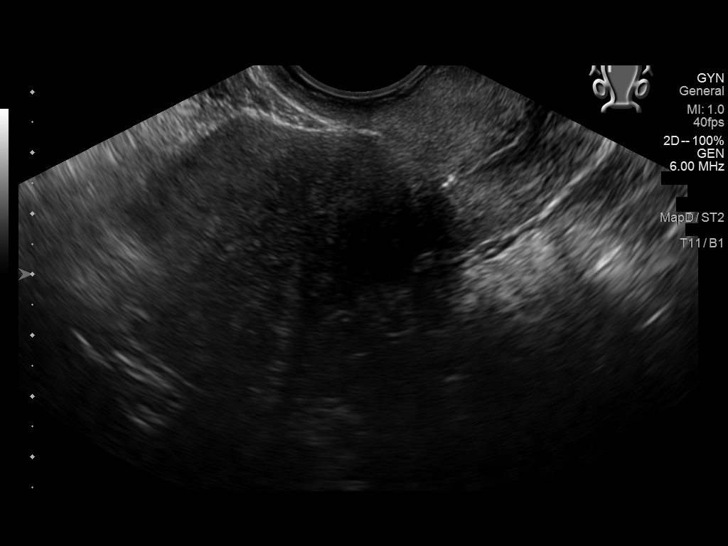
[im 58/115]
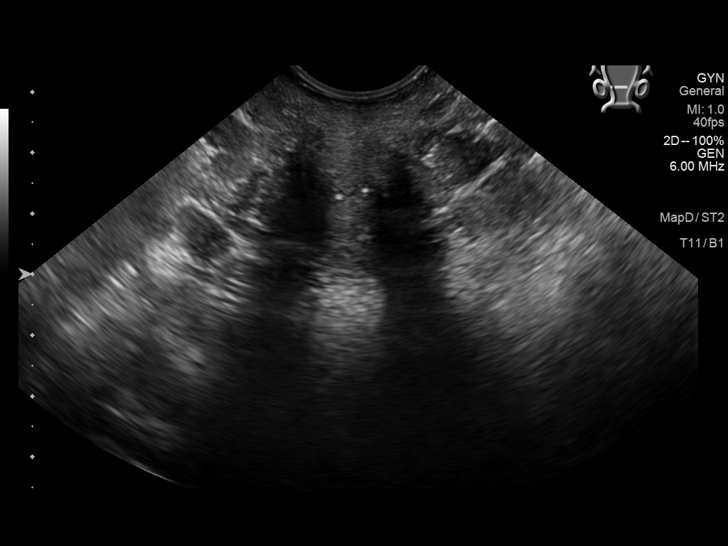
[im 67/115]
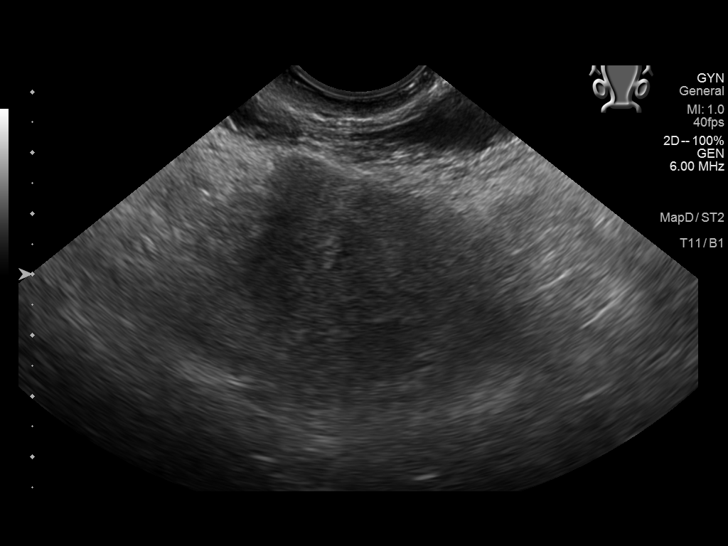
[im 77/115]
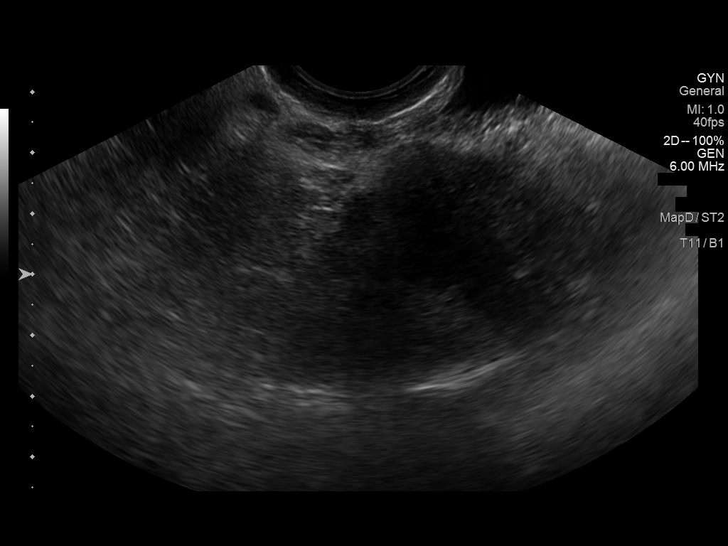
[im 86/115]
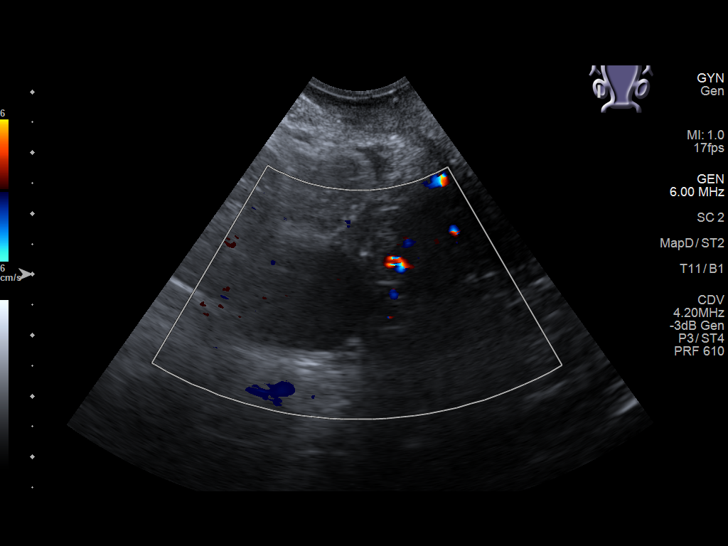
[im 96/115]
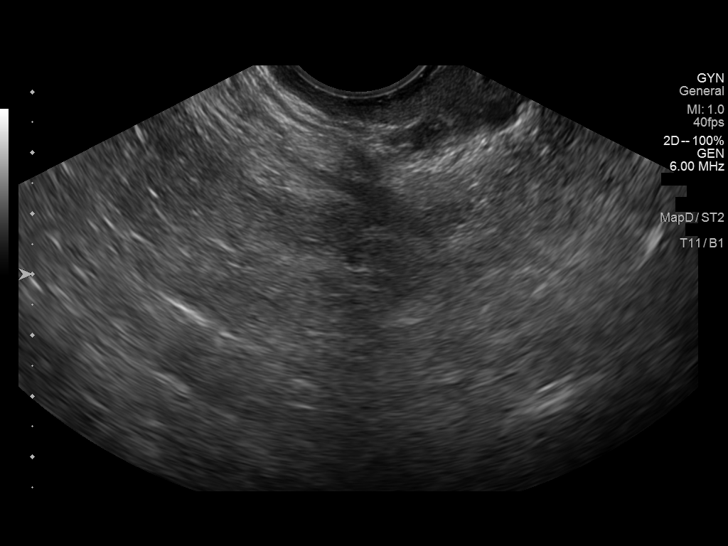
[im 105/115]
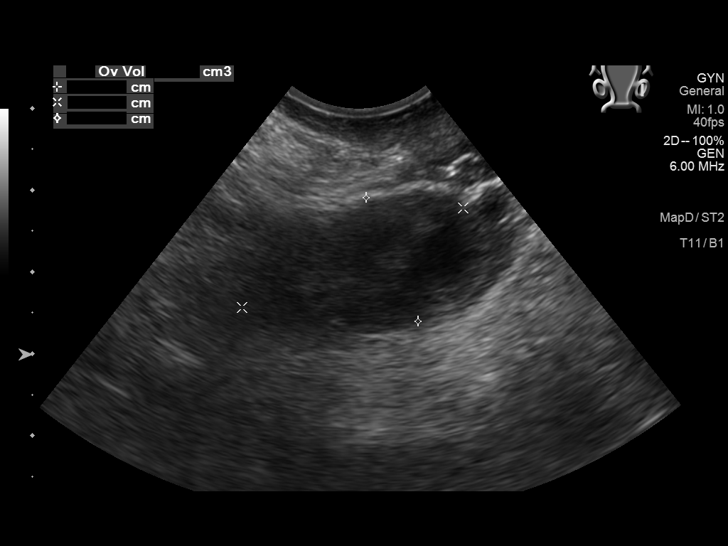
[im 115/115]
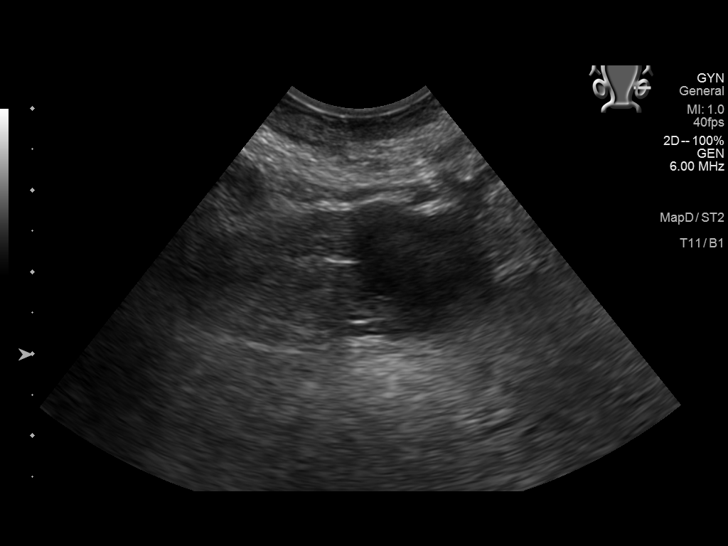

[13 of 25 positions shown; findings below may reference images not displayed]

FINDINGS: Uterus

Measurements: 9.8 x 3.9 x 5.4 cm = volume: 108.6 mL. No fibroids or
other mass visualized.

Endometrium

Thickness: 5.4 mm.  No focal abnormality visualized.

Right ovary

Measurements: 3.0 x 1.7 x 1.6 cm = volume: 4.5 mL. Normal
appearance/no adnexal mass. 1.6 cm simple cyst noted, most
consistent with a normal physiologic follicular cyst/dominant
follicle.

Left ovary

Measurements: 3.0 x 1.6 x 1.7 cm = volume: 4.2 mL. Normal
appearance/no adnexal mass. Approximate 1.6 cm mildly complex
hypoechoic lesion most consistent with a degenerating corpus luteal
cyst.

Other findings

No abnormal free fluid.
IMPRESSION: 1. 1.6 cm simple right ovarian cyst, most consistent with a normal
physiologic follicular cyst/dominant follicle.
2. Approximate 1.6 cm degenerating left ovarian corpus luteal cyst.
3. Otherwise unremarkable and normal pelvic ultrasound. No other
acute abnormality identified.

## 2021-07-15 DIAGNOSIS — S99912A Unspecified injury of left ankle, initial encounter: Secondary | ICD-10-CM | POA: Diagnosis not present

## 2021-10-17 DIAGNOSIS — D2262 Melanocytic nevi of left upper limb, including shoulder: Secondary | ICD-10-CM | POA: Diagnosis not present

## 2021-10-17 DIAGNOSIS — D225 Melanocytic nevi of trunk: Secondary | ICD-10-CM | POA: Diagnosis not present

## 2021-10-17 DIAGNOSIS — D2271 Melanocytic nevi of right lower limb, including hip: Secondary | ICD-10-CM | POA: Diagnosis not present

## 2021-10-17 DIAGNOSIS — D2261 Melanocytic nevi of right upper limb, including shoulder: Secondary | ICD-10-CM | POA: Diagnosis not present

## 2021-10-17 DIAGNOSIS — D2272 Melanocytic nevi of left lower limb, including hip: Secondary | ICD-10-CM | POA: Diagnosis not present

## 2021-10-17 DIAGNOSIS — L821 Other seborrheic keratosis: Secondary | ICD-10-CM | POA: Diagnosis not present

## 2021-10-17 DIAGNOSIS — L814 Other melanin hyperpigmentation: Secondary | ICD-10-CM | POA: Diagnosis not present

## 2022-01-27 DIAGNOSIS — Z6831 Body mass index (BMI) 31.0-31.9, adult: Secondary | ICD-10-CM | POA: Diagnosis not present

## 2022-01-27 DIAGNOSIS — Z01419 Encounter for gynecological examination (general) (routine) without abnormal findings: Secondary | ICD-10-CM | POA: Diagnosis not present

## 2022-01-27 DIAGNOSIS — N926 Irregular menstruation, unspecified: Secondary | ICD-10-CM | POA: Diagnosis not present

## 2022-01-27 DIAGNOSIS — Z1231 Encounter for screening mammogram for malignant neoplasm of breast: Secondary | ICD-10-CM | POA: Diagnosis not present

## 2022-09-19 ENCOUNTER — Ambulatory Visit: Admit: 2022-09-19 | Payer: Self-pay

## 2022-11-06 DIAGNOSIS — S0086XA Insect bite (nonvenomous) of other part of head, initial encounter: Secondary | ICD-10-CM | POA: Diagnosis not present

## 2022-11-06 DIAGNOSIS — D2272 Melanocytic nevi of left lower limb, including hip: Secondary | ICD-10-CM | POA: Diagnosis not present

## 2022-11-06 DIAGNOSIS — L814 Other melanin hyperpigmentation: Secondary | ICD-10-CM | POA: Diagnosis not present

## 2022-11-06 DIAGNOSIS — D2261 Melanocytic nevi of right upper limb, including shoulder: Secondary | ICD-10-CM | POA: Diagnosis not present

## 2022-11-06 DIAGNOSIS — D2271 Melanocytic nevi of right lower limb, including hip: Secondary | ICD-10-CM | POA: Diagnosis not present

## 2022-11-06 DIAGNOSIS — D225 Melanocytic nevi of trunk: Secondary | ICD-10-CM | POA: Diagnosis not present

## 2022-11-06 DIAGNOSIS — D2262 Melanocytic nevi of left upper limb, including shoulder: Secondary | ICD-10-CM | POA: Diagnosis not present

## 2022-11-25 DIAGNOSIS — Z0000 Encounter for general adult medical examination without abnormal findings: Secondary | ICD-10-CM | POA: Diagnosis not present

## 2022-11-25 DIAGNOSIS — Z1211 Encounter for screening for malignant neoplasm of colon: Secondary | ICD-10-CM | POA: Diagnosis not present

## 2022-11-25 DIAGNOSIS — R5383 Other fatigue: Secondary | ICD-10-CM | POA: Diagnosis not present

## 2022-11-25 DIAGNOSIS — J302 Other seasonal allergic rhinitis: Secondary | ICD-10-CM | POA: Diagnosis not present

## 2022-11-26 DIAGNOSIS — R5383 Other fatigue: Secondary | ICD-10-CM | POA: Diagnosis not present

## 2022-11-26 DIAGNOSIS — Z0000 Encounter for general adult medical examination without abnormal findings: Secondary | ICD-10-CM | POA: Diagnosis not present

## 2022-12-21 DIAGNOSIS — E669 Obesity, unspecified: Secondary | ICD-10-CM | POA: Diagnosis not present

## 2022-12-21 DIAGNOSIS — J302 Other seasonal allergic rhinitis: Secondary | ICD-10-CM | POA: Diagnosis not present

## 2022-12-21 DIAGNOSIS — N926 Irregular menstruation, unspecified: Secondary | ICD-10-CM | POA: Diagnosis not present

## 2022-12-21 DIAGNOSIS — Z6832 Body mass index (BMI) 32.0-32.9, adult: Secondary | ICD-10-CM | POA: Diagnosis not present

## 2023-06-29 ENCOUNTER — Encounter: Payer: Self-pay | Admitting: *Deleted

## 2023-07-12 ENCOUNTER — Encounter: Admission: RE | Payer: Self-pay | Source: Home / Self Care

## 2023-07-12 ENCOUNTER — Ambulatory Visit: Admission: RE | Admit: 2023-07-12 | Payer: Self-pay | Source: Home / Self Care

## 2023-07-12 SURGERY — COLONOSCOPY
Anesthesia: General

## 2023-10-07 ENCOUNTER — Encounter: Payer: Self-pay | Admitting: *Deleted

## 2023-10-18 ENCOUNTER — Encounter
Admission: RE | Disposition: A | Discharge status: Home / Self Care | Payer: Self-pay | Source: Home / Self Care | Attending: Gastroenterology

## 2023-10-18 ENCOUNTER — Ambulatory Visit
Admission: RE | Admit: 2023-10-18 | Discharge: 2023-10-18 | Disposition: A | Discharge status: Home / Self Care | Attending: Gastroenterology | Admitting: Gastroenterology

## 2023-10-18 ENCOUNTER — Ambulatory Visit: Admitting: Certified Registered"

## 2023-10-18 DIAGNOSIS — Z6833 Body mass index (BMI) 33.0-33.9, adult: Secondary | ICD-10-CM | POA: Insufficient documentation

## 2023-10-18 DIAGNOSIS — Z800 Family history of malignant neoplasm of digestive organs: Secondary | ICD-10-CM | POA: Insufficient documentation

## 2023-10-18 DIAGNOSIS — Z83719 Family history of colon polyps, unspecified: Secondary | ICD-10-CM | POA: Insufficient documentation

## 2023-10-18 DIAGNOSIS — Z1211 Encounter for screening for malignant neoplasm of colon: Secondary | ICD-10-CM | POA: Insufficient documentation

## 2023-10-18 DIAGNOSIS — F419 Anxiety disorder, unspecified: Secondary | ICD-10-CM | POA: Insufficient documentation

## 2023-10-18 DIAGNOSIS — E669 Obesity, unspecified: Secondary | ICD-10-CM | POA: Diagnosis not present

## 2023-10-18 DIAGNOSIS — D122 Benign neoplasm of ascending colon: Secondary | ICD-10-CM | POA: Diagnosis not present

## 2023-10-18 DIAGNOSIS — K640 First degree hemorrhoids: Secondary | ICD-10-CM | POA: Insufficient documentation

## 2023-10-18 HISTORY — PX: POLYPECTOMY: SHX149

## 2023-10-18 HISTORY — PX: COLONOSCOPY: SHX5424

## 2023-10-18 LAB — POCT PREGNANCY, URINE: Preg Test, Ur: NEGATIVE

## 2023-10-18 SURGERY — COLONOSCOPY
Anesthesia: General

## 2023-10-18 MED ORDER — LIDOCAINE HCL (CARDIAC) PF 100 MG/5ML IV SOSY
PREFILLED_SYRINGE | INTRAVENOUS | Status: DC | PRN
  Administered 2023-10-18: 100 mg via INTRAVENOUS

## 2023-10-18 MED ORDER — PROPOFOL 500 MG/50ML IV EMUL
INTRAVENOUS | Status: DC | PRN
  Administered 2023-10-18: 150 ug/kg/min via INTRAVENOUS
  Administered 2023-10-18: 50 mg via INTRAVENOUS

## 2023-10-18 MED ORDER — DEXMEDETOMIDINE HCL IN NACL 80 MCG/20ML IV SOLN
INTRAVENOUS | Status: DC | PRN
  Administered 2023-10-18: 12 ug via INTRAVENOUS

## 2023-10-18 MED ORDER — SODIUM CHLORIDE 0.9 % IV SOLN
INTRAVENOUS | Status: DC
  Administered 2023-10-18: 20 mL/h via INTRAVENOUS

## 2023-10-18 MED ORDER — PROPOFOL 1000 MG/100ML IV EMUL
INTRAVENOUS | Status: AC
  Filled 2023-10-18: qty 100

## 2023-10-18 NOTE — Anesthesia Preprocedure Evaluation (Signed)
 Anesthesia Evaluation  Patient identified by MRN, date of birth, ID band Patient awake    Reviewed: Allergy & Precautions, NPO status , Patient's Chart, lab work & pertinent test results  Airway Mallampati: II  TM Distance: >3 FB Neck ROM: Full    Dental  (+) Teeth Intact   Pulmonary neg pulmonary ROS   Pulmonary exam normal breath sounds clear to auscultation       Cardiovascular Exercise Tolerance: Good negative cardio ROS Normal cardiovascular exam Rhythm:Regular Rate:Normal     Neuro/Psych   Anxiety     negative neurological ROS  negative psych ROS   GI/Hepatic negative GI ROS, Neg liver ROS,,,  Endo/Other  negative endocrine ROS    Renal/GU negative Renal ROS  negative genitourinary   Musculoskeletal   Abdominal Normal abdominal exam  (+)   Peds negative pediatric ROS (+)  Hematology negative hematology ROS (+)   Anesthesia Other Findings Past Medical History: No date: AMA (advanced maternal age) multigravida 35+ No date: H/O varicella No date: Seizures (HCC)     Comment:  as teen hormone related treated  Past Surgical History: No date: NO PAST SURGERIES  BMI    Body Mass Index: 33.37 kg/m      Reproductive/Obstetrics negative OB ROS                              Anesthesia Physical Anesthesia Plan  ASA: 2  Anesthesia Plan: General   Post-op Pain Management:    Induction: Intravenous  PONV Risk Score and Plan: Propofol  infusion and TIVA  Airway Management Planned: Natural Airway and Nasal Cannula  Additional Equipment:   Intra-op Plan:   Post-operative Plan:   Informed Consent: I have reviewed the patients History and Physical, chart, labs and discussed the procedure including the risks, benefits and alternatives for the proposed anesthesia with the patient or authorized representative who has indicated his/her understanding and acceptance.     Dental  Advisory Given  Plan Discussed with: CRNA  Anesthesia Plan Comments:         Anesthesia Quick Evaluation

## 2023-10-18 NOTE — H&P (Signed)
 Outpatient short stay form Pre-procedure 10/18/2023  Jacqueline ONEIDA Schick, MD  Primary Physician: Mylinda Lenis, MD  Reason for visit:  Screening colonoscopy  History of present illness:    48 y/o lady with history of obesity here for index screening colonoscopy. No blood thinners. No first degree relatives with GI malignancies. No significant abdominal surgeries.    Current Facility-Administered Medications:    0.9 %  sodium chloride  infusion, , Intravenous, Continuous, Mckaylee Dimalanta, Jacqueline ONEIDA, MD, Last Rate: 20 mL/hr at 10/18/23 0723, 20 mL/hr at 10/18/23 0723  Medications Prior to Admission  Medication Sig Dispense Refill Last Dose/Taking   benzonatate  (TESSALON ) 100 MG capsule Take 2 capsules (200 mg total) by mouth every 8 (eight) hours. 21 capsule 0 Past Week   ipratropium (ATROVENT ) 0.06 % nasal spray Place 2 sprays into both nostrils 4 (four) times daily. 15 mL 12 Past Week   promethazine -dextromethorphan (PROMETHAZINE -DM) 6.25-15 MG/5ML syrup Take 5 mLs by mouth 4 (four) times daily as needed. 118 mL 0 Past Week     Allergies  Allergen Reactions   Erythromycin Nausea And Vomiting     Past Medical History:  Diagnosis Date   AMA (advanced maternal age) multigravida 35+    H/O varicella    Seizures (HCC)    as teen hormone related treated    Review of systems:  Otherwise negative.    Physical Exam  Gen: Alert, oriented. Appears stated age.  HEENT: PERRLA. Lungs: No respiratory distress CV: RRR Abd: soft, benign, no masses Ext: No edema    Planned procedures: Proceed with colonoscopy. The patient understands the nature of the planned procedure, indications, risks, alternatives and potential complications including but not limited to bleeding, infection, perforation, damage to internal organs and possible oversedation/side effects from anesthesia. The patient agrees and gives consent to proceed.  Please refer to procedure notes for findings, recommendations and  patient disposition/instructions.     Jacqueline ONEIDA Schick, MD [facility]

## 2023-10-18 NOTE — Anesthesia Postprocedure Evaluation (Signed)
 Anesthesia Post Note  Patient: Jacqueline Malone  Procedure(s) Performed: COLONOSCOPY POLYPECTOMY, INTESTINE  Patient location during evaluation: PACU Anesthesia Type: General Level of consciousness: awake and alert Pain management: satisfactory to patient Vital Signs Assessment: post-procedure vital signs reviewed and stable Cardiovascular status: stable Anesthetic complications: no (IV infiltration L hand, cold compress applied.)   There were no known notable events for this encounter.   Last Vitals:  Vitals:   10/18/23 0815 10/18/23 0835  BP: (!) 90/46 110/78  Pulse: 65 65  Resp: 12 10  Temp: (!) 36.3 C   SpO2: 96% 98%    Last Pain:  Vitals:   10/18/23 0835  TempSrc:   PainSc: 2                  VAN STAVEREN,Mihran Lebarron

## 2023-10-18 NOTE — Op Note (Signed)
 [facility] Patient Name: Jacqueline Malone Procedure Date: 10/18/2023 7:13 AM MRN: [id number] Account #: [id number] Date of Birth: 03-09-1976 Admit Type: Outpatient Age: 48 Room: [facility] ENDO ROOM 3 Gender: Female Note Status: Supervisor Override Instrument Name: [id number] Procedure:             Colonoscopy Indications:           Colon cancer screening in patient at increased risk:                         Family history of 1st-degree relative with colon                         polyps, Family history of colon cancer in a                         first-degree relative before age 54 years Providers:             Ole Schick MD, MD Referring MD:          Ole Schick MD, MD (Referring MD), Alm SAILOR.                         Mylinda, MD (Referring MD) Medicines:             Monitored Anesthesia Care Complications:         No immediate complications. Estimated blood loss:                         Minimal. Procedure:             Pre-Anesthesia Assessment:                        - Prior to the procedure, a History and Physical was                         performed, and patient medications and allergies were                         reviewed. The patient is competent. The risks and                         benefits of the procedure and the sedation options and                         risks were discussed with the patient. All questions                         were answered and informed consent was obtained.                         Patient identification and proposed procedure were                         verified by the physician, the nurse, the                         anesthesiologist, the anesthetist and the technician  in the endoscopy suite. Mental Status Examination:                         alert and oriented. Airway Examination: normal                         oropharyngeal airway and neck mobility. Respiratory                          Examination: clear to auscultation. CV Examination:                         normal. Prophylactic Antibiotics: The patient does not                         require prophylactic antibiotics. Prior                         Anticoagulants: The patient has taken no anticoagulant                         or antiplatelet agents. ASA Grade Assessment: II - A                         patient with mild systemic disease. After reviewing                         the risks and benefits, the patient was deemed in                         satisfactory condition to undergo the procedure. The                         anesthesia plan was to use monitored anesthesia care                         (MAC). Immediately prior to administration of                         medications, the patient was re-assessed for adequacy                         to receive sedatives. The heart rate, respiratory                         rate, oxygen saturations, blood pressure, adequacy of                         pulmonary ventilation, and response to care were                         monitored throughout the procedure. The physical                         status of the patient was re-assessed after the                         procedure.  After obtaining informed consent, the colonoscope was                         passed under direct vision. Throughout the procedure,                         the patient's blood pressure, pulse, and oxygen                         saturations were monitored continuously. The                         Colonoscope was introduced through the anus and                         advanced to the the terminal ileum, with                         identification of the appendiceal orifice and IC                         valve. The colonoscopy was performed without                         difficulty. The patient tolerated the procedure well.                         The quality of the bowel  preparation was excellent.                         The terminal ileum, ileocecal valve, appendiceal                         orifice, and rectum were photographed. Findings:      The perianal and digital rectal examinations were normal.      The terminal ileum appeared normal.      A 1 mm polyp was found in the ascending colon. The polyp was sessile.       The polyp was removed with a jumbo cold forceps. Resection and retrieval       were complete. Estimated blood loss was minimal.      Internal hemorrhoids were found during retroflexion. The hemorrhoids       were Grade I (internal hemorrhoids that do not prolapse).      The exam was otherwise without abnormality on direct and retroflexion       views. Impression:            - The examined portion of the ileum was normal.                        - One 1 mm polyp in the ascending colon, removed with                         a jumbo cold forceps. Resected and retrieved.                        - Internal hemorrhoids.                        -  The examination was otherwise normal on direct and                         retroflexion views. Recommendation:        - Discharge patient to home.                        - Resume previous diet.                        - Continue present medications.                        - Await pathology results.                        - Repeat colonoscopy in 7-10 years for surveillance.                        - Return to referring physician as previously                         scheduled. Procedure Code(s):     --- Professional ---                        [phone], Colonoscopy, flexible; with biopsy, single or                         multiple Diagnosis Code(s):     --- Professional ---                        Z83.71, Family history of colonic polyps                        K64.0, First degree hemorrhoids                        D12.2, Benign neoplasm of ascending colon CPT copyright 2022 [employer]. All  rights reserved. The codes documented in this report are preliminary and upon coder review may  be revised to meet current compliance requirements. Ole Schick MD, MD 10/18/2023 8:16:17 AM Number of Addenda: 0 Note Initiated On: 10/18/2023 7:13 AM Scope Withdrawal Time: 0 hours 18 minutes 48 seconds  Total Procedure Duration: 0 hours 23 minutes 56 seconds  Estimated Blood Loss:  Estimated blood loss was minimal.      [facility]

## 2023-10-18 NOTE — Interval H&P Note (Signed)
 History and Physical Interval Note:  10/18/2023 7:56 AM  Jacqueline Malone  has presented today for surgery, with the diagnosis of Screening for colon cancer (Z12.11) Family hx colonic polyps (Z83.719) Family hx of colon cancer (Z80.0).  The various methods of treatment have been discussed with the patient and family. After consideration of risks, benefits and other options for treatment, the patient has consented to  Procedure(s): COLONOSCOPY (N/A) as a surgical intervention.  The patient's history has been reviewed, patient examined, no change in status, stable for surgery.  I have reviewed the patient's chart and labs.  Questions were answered to the patient's satisfaction.     Jacqueline Malone  Ok to proceed with colonoscopy

## 2023-10-18 NOTE — Transfer of Care (Signed)
 Immediate Anesthesia Transfer of Care Note  Patient: Jacqueline Malone  Procedure(s) Performed: COLONOSCOPY POLYPECTOMY, INTESTINE  Patient Location: PACU  Anesthesia Type:General  Level of Consciousness: drowsy and patient cooperative  Airway & Oxygen Therapy: Patient Spontanous Breathing  Post-op Assessment: Report given to RN and Post -op Vital signs reviewed and stable  Post vital signs: stable  Last Vitals:  Vitals Value Taken Time  BP    Temp 36.3 C 10/18/23 08:15  Pulse 65 10/18/23 08:15  Resp 12 10/18/23 08:15  SpO2 96 % 10/18/23 08:15    Last Pain:  Vitals:   10/18/23 0815  TempSrc: Tympanic  PainSc: Asleep         Complications: No notable events documented.

## 2023-10-19 ENCOUNTER — Encounter: Payer: Self-pay | Admitting: Gastroenterology

## 2023-10-19 LAB — SURGICAL PATHOLOGY
